# Patient Record
Sex: Female | Born: 1950 | Race: White | Hispanic: No | Marital: Married
Health system: Southern US, Community
[De-identification: ages and names within clinical notes are randomized; demographics above are authoritative.]

---

## 1999-10-10 ENCOUNTER — Other Ambulatory Visit: Admission: RE | Admit: 1999-10-10 | Discharge: 1999-10-10 | Payer: Self-pay | Admitting: *Deleted

## 1999-11-15 ENCOUNTER — Ambulatory Visit (HOSPITAL_COMMUNITY): Admission: RE | Admit: 1999-11-15 | Discharge: 1999-11-15 | Payer: Self-pay | Admitting: Obstetrics and Gynecology

## 2002-02-24 ENCOUNTER — Other Ambulatory Visit: Admission: RE | Admit: 2002-02-24 | Discharge: 2002-02-24 | Payer: Self-pay | Admitting: Obstetrics and Gynecology

## 2003-03-16 ENCOUNTER — Other Ambulatory Visit: Admission: RE | Admit: 2003-03-16 | Discharge: 2003-03-16 | Payer: Self-pay | Admitting: Obstetrics and Gynecology

## 2004-04-04 ENCOUNTER — Other Ambulatory Visit: Admission: RE | Admit: 2004-04-04 | Discharge: 2004-04-04 | Payer: Self-pay | Admitting: Obstetrics and Gynecology

## 2014-07-24 ENCOUNTER — Other Ambulatory Visit: Payer: Self-pay | Admitting: Obstetrics & Gynecology

## 2014-07-24 DIAGNOSIS — R928 Other abnormal and inconclusive findings on diagnostic imaging of breast: Secondary | ICD-10-CM

## 2014-07-30 ENCOUNTER — Ambulatory Visit
Admission: RE | Admit: 2014-07-30 | Discharge: 2014-07-30 | Disposition: A | Discharge status: Home / Self Care | Payer: BC Managed Care – PPO | Source: Ambulatory Visit | Attending: Obstetrics & Gynecology | Admitting: Obstetrics & Gynecology

## 2014-07-30 ENCOUNTER — Encounter (INDEPENDENT_AMBULATORY_CARE_PROVIDER_SITE_OTHER): Payer: Self-pay

## 2014-07-30 DIAGNOSIS — R928 Other abnormal and inconclusive findings on diagnostic imaging of breast: Secondary | ICD-10-CM

## 2015-04-02 IMAGING — MG MM DIAGNOSTIC UNILATERAL L
2 series · 2 of 2 positions shown · non-contrast
Comparison: 07/20/2014

CLINICAL DATA: Possible mass left breast identified on recent
screening mammogram dated 07/20/2014.

EXAM:
DIGITAL DIAGNOSTIC  LEFT MAMMOGRAM
ULTRASOUND LEFT BREAST

[L MLO]
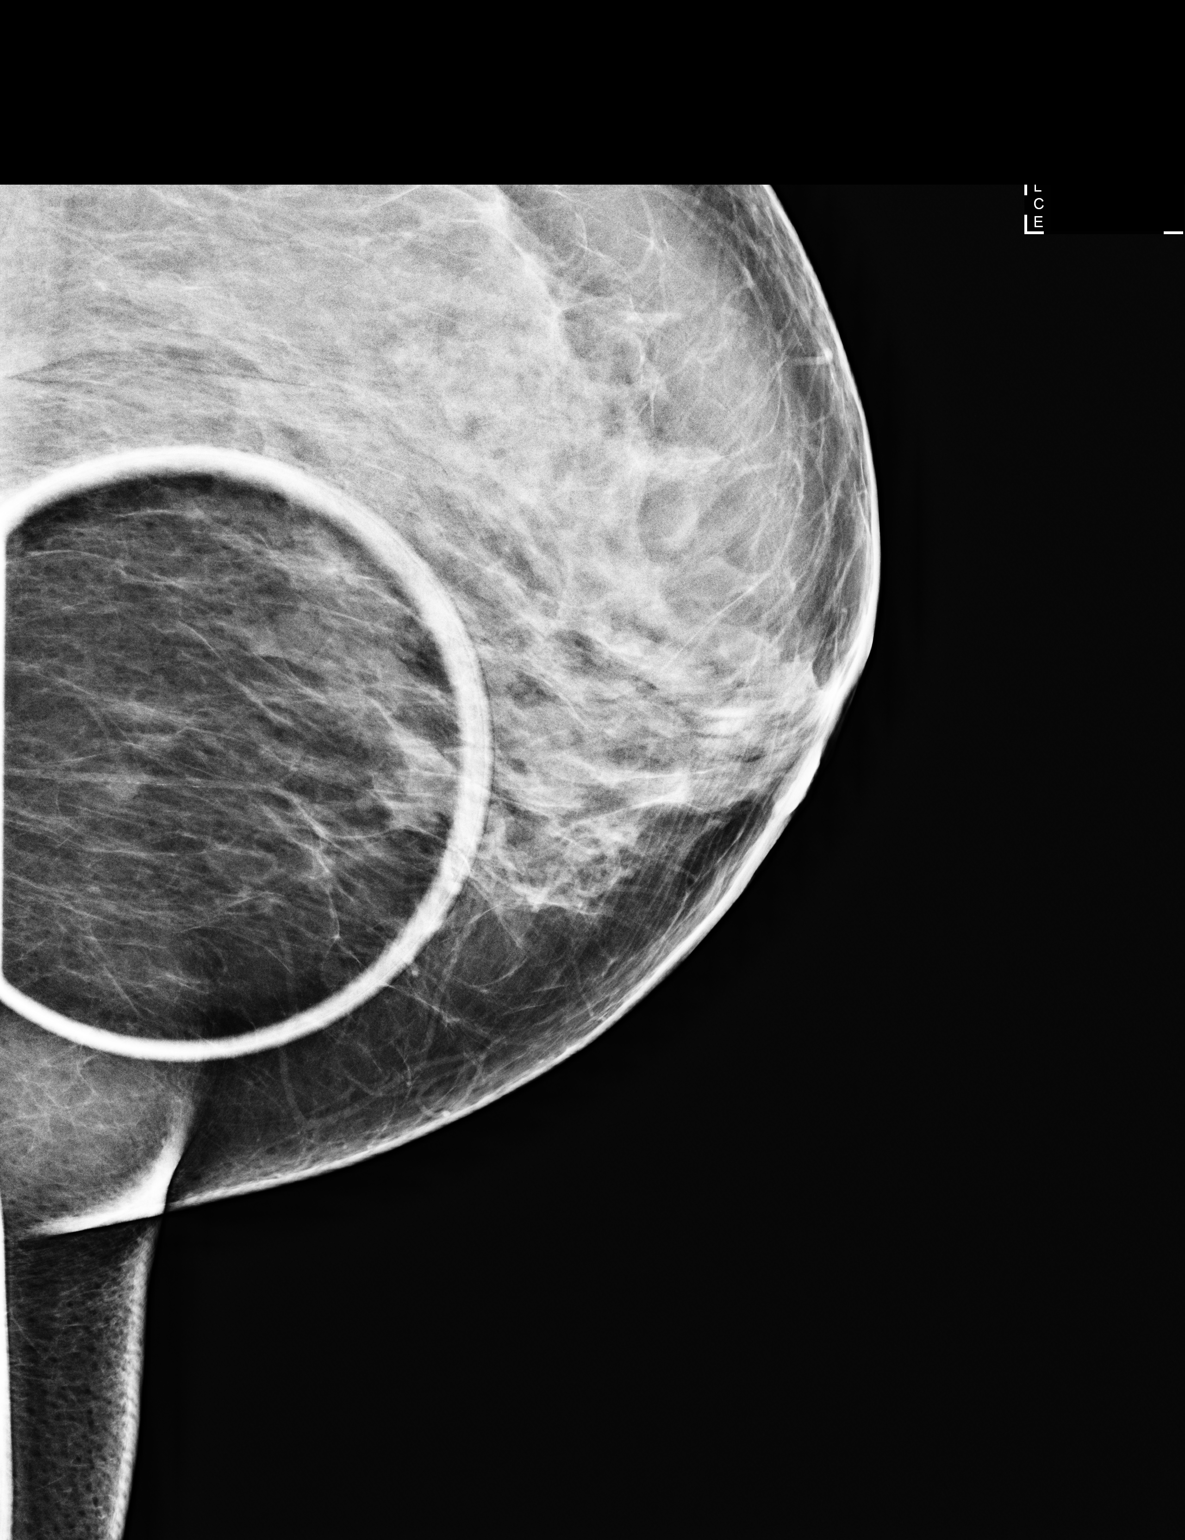

[L CC]
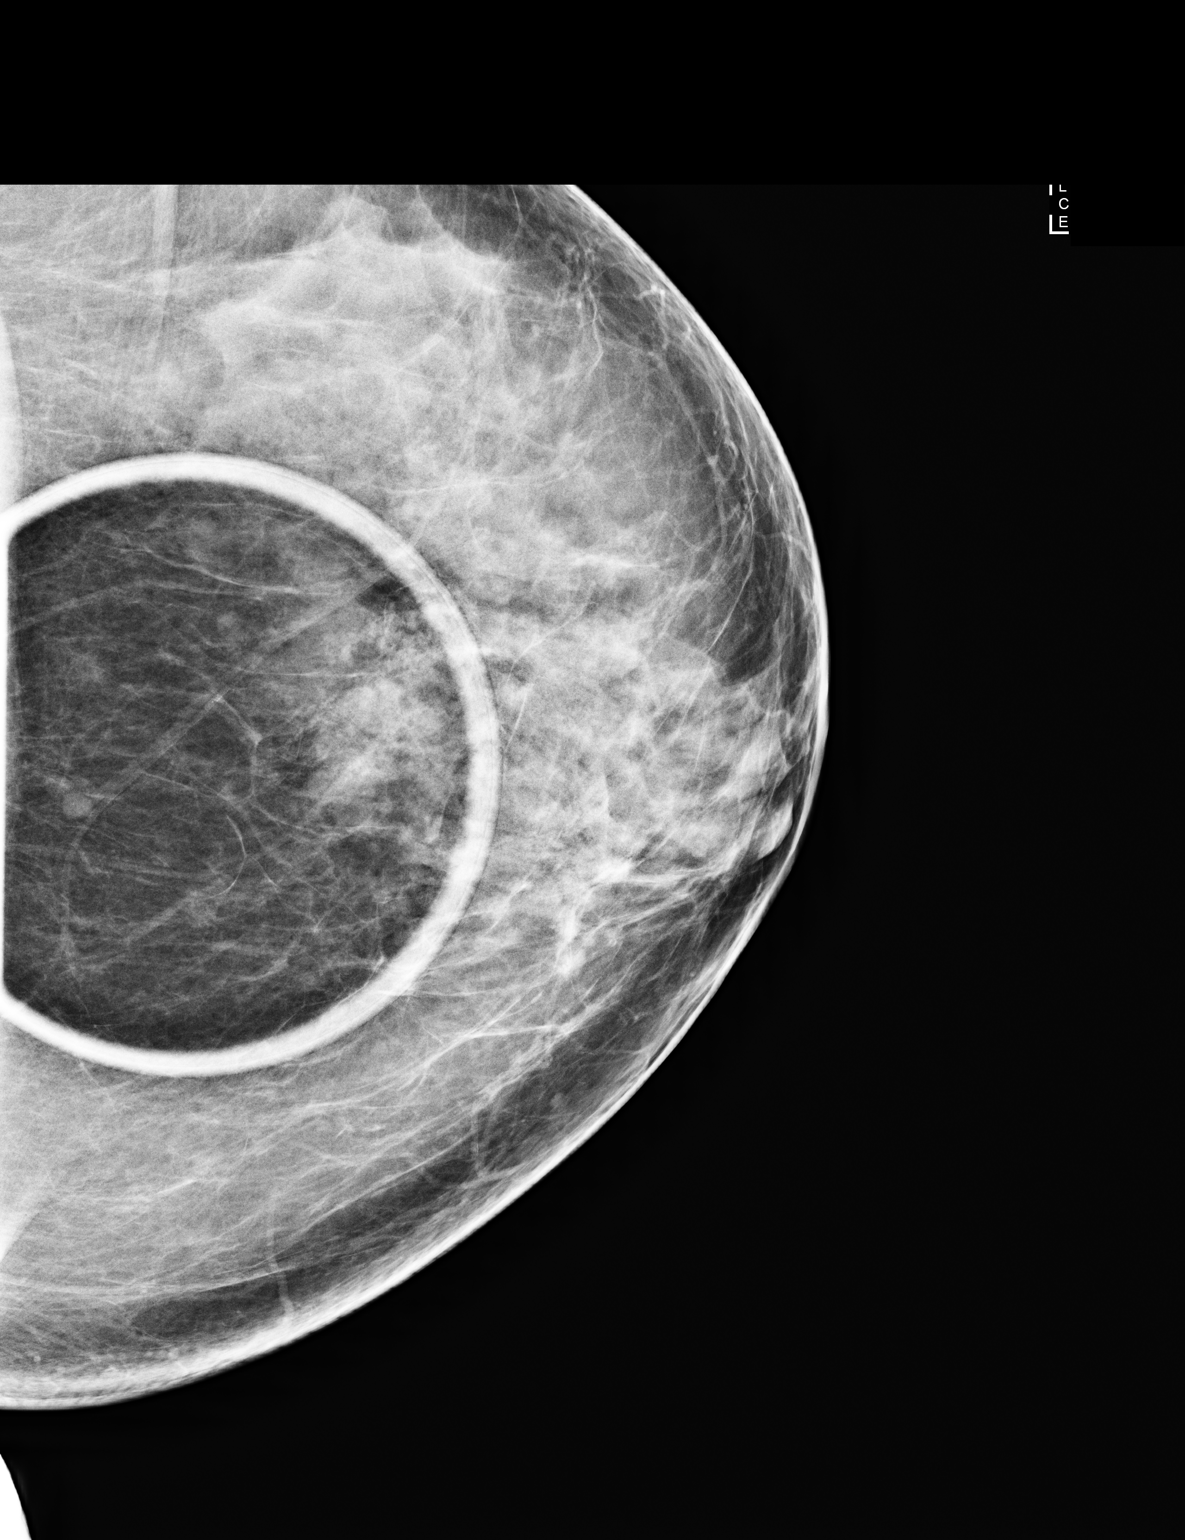

[2 of 2 positions shown; findings below may reference images not displayed]

ACR Breast Density Category c: The breast tissue is heterogeneously
dense, which may obscure small masses.
FINDINGS: Focal spot compression views of the lower central left breast
demonstrate a circumscribed subcentimeter nodule.

On physical exam, no mass is palpated in the inferior left breast.

Ultrasound is performed, showing a 5 x 4 x 3 mm simple cyst at 6
o'clock position 2 cm from the nipple. No solid or suspicious masses
seen on ultrasound.
IMPRESSION: 5 mm simple cyst 6 o'clock position left breast. No evidence of
malignancy.

RECOMMENDATION:
Screening mammogram in one year.(Code:[id number])

I have discussed the findings and recommendations with the patient.
Results were also provided in writing at the conclusion of the
visit. If applicable, a reminder letter will be sent to the patient
regarding the next appointment.

BI-RADS CATEGORY  2: Benign.

## 2016-08-02 DIAGNOSIS — E785 Hyperlipidemia, unspecified: Secondary | ICD-10-CM | POA: Diagnosis not present

## 2016-08-02 DIAGNOSIS — Z23 Encounter for immunization: Secondary | ICD-10-CM | POA: Diagnosis not present

## 2016-08-02 DIAGNOSIS — Z1389 Encounter for screening for other disorder: Secondary | ICD-10-CM | POA: Diagnosis not present

## 2016-08-16 DIAGNOSIS — Z6824 Body mass index (BMI) 24.0-24.9, adult: Secondary | ICD-10-CM | POA: Diagnosis not present

## 2016-08-16 DIAGNOSIS — N390 Urinary tract infection, site not specified: Secondary | ICD-10-CM | POA: Diagnosis not present

## 2016-08-16 DIAGNOSIS — Z01419 Encounter for gynecological examination (general) (routine) without abnormal findings: Secondary | ICD-10-CM | POA: Diagnosis not present

## 2016-08-16 DIAGNOSIS — Z124 Encounter for screening for malignant neoplasm of cervix: Secondary | ICD-10-CM | POA: Diagnosis not present

## 2016-08-16 DIAGNOSIS — Z1231 Encounter for screening mammogram for malignant neoplasm of breast: Secondary | ICD-10-CM | POA: Diagnosis not present

## 2016-08-28 DIAGNOSIS — N940 Mittelschmerz: Secondary | ICD-10-CM | POA: Diagnosis not present

## 2016-08-28 DIAGNOSIS — N840 Polyp of corpus uteri: Secondary | ICD-10-CM | POA: Diagnosis not present

## 2016-08-28 DIAGNOSIS — N950 Postmenopausal bleeding: Secondary | ICD-10-CM | POA: Diagnosis not present

## 2016-09-04 DIAGNOSIS — Z09 Encounter for follow-up examination after completed treatment for conditions other than malignant neoplasm: Secondary | ICD-10-CM | POA: Diagnosis not present

## 2016-09-04 DIAGNOSIS — D509 Iron deficiency anemia, unspecified: Secondary | ICD-10-CM | POA: Diagnosis not present

## 2016-10-19 DIAGNOSIS — R102 Pelvic and perineal pain: Secondary | ICD-10-CM | POA: Diagnosis not present

## 2016-12-06 DIAGNOSIS — N840 Polyp of corpus uteri: Secondary | ICD-10-CM | POA: Diagnosis not present

## 2016-12-06 DIAGNOSIS — N950 Postmenopausal bleeding: Secondary | ICD-10-CM | POA: Diagnosis not present

## 2017-01-17 DIAGNOSIS — Z1211 Encounter for screening for malignant neoplasm of colon: Secondary | ICD-10-CM | POA: Diagnosis not present

## 2017-01-17 DIAGNOSIS — Z1389 Encounter for screening for other disorder: Secondary | ICD-10-CM | POA: Diagnosis not present

## 2017-01-17 DIAGNOSIS — Z0000 Encounter for general adult medical examination without abnormal findings: Secondary | ICD-10-CM | POA: Diagnosis not present

## 2017-06-27 DIAGNOSIS — H10413 Chronic giant papillary conjunctivitis, bilateral: Secondary | ICD-10-CM | POA: Diagnosis not present

## 2017-07-18 DIAGNOSIS — M549 Dorsalgia, unspecified: Secondary | ICD-10-CM | POA: Diagnosis not present

## 2017-07-18 DIAGNOSIS — N390 Urinary tract infection, site not specified: Secondary | ICD-10-CM | POA: Diagnosis not present

## 2017-07-18 DIAGNOSIS — E785 Hyperlipidemia, unspecified: Secondary | ICD-10-CM | POA: Diagnosis not present

## 2017-07-18 DIAGNOSIS — Z1389 Encounter for screening for other disorder: Secondary | ICD-10-CM | POA: Diagnosis not present

## 2017-08-28 DIAGNOSIS — Z23 Encounter for immunization: Secondary | ICD-10-CM | POA: Diagnosis not present

## 2017-08-28 DIAGNOSIS — J309 Allergic rhinitis, unspecified: Secondary | ICD-10-CM | POA: Diagnosis not present

## 2017-12-24 DIAGNOSIS — Z6825 Body mass index (BMI) 25.0-25.9, adult: Secondary | ICD-10-CM | POA: Diagnosis not present

## 2017-12-24 DIAGNOSIS — Z1231 Encounter for screening mammogram for malignant neoplasm of breast: Secondary | ICD-10-CM | POA: Diagnosis not present

## 2017-12-24 DIAGNOSIS — Z01419 Encounter for gynecological examination (general) (routine) without abnormal findings: Secondary | ICD-10-CM | POA: Diagnosis not present

## 2018-01-15 DIAGNOSIS — Z1211 Encounter for screening for malignant neoplasm of colon: Secondary | ICD-10-CM | POA: Diagnosis not present

## 2018-01-15 DIAGNOSIS — Z1212 Encounter for screening for malignant neoplasm of rectum: Secondary | ICD-10-CM | POA: Diagnosis not present

## 2018-01-21 DIAGNOSIS — J309 Allergic rhinitis, unspecified: Secondary | ICD-10-CM | POA: Diagnosis not present

## 2018-01-21 DIAGNOSIS — Z0000 Encounter for general adult medical examination without abnormal findings: Secondary | ICD-10-CM | POA: Diagnosis not present

## 2018-01-21 DIAGNOSIS — M8588 Other specified disorders of bone density and structure, other site: Secondary | ICD-10-CM | POA: Diagnosis not present

## 2018-01-21 DIAGNOSIS — E785 Hyperlipidemia, unspecified: Secondary | ICD-10-CM | POA: Diagnosis not present

## 2018-05-01 DIAGNOSIS — B373 Candidiasis of vulva and vagina: Secondary | ICD-10-CM | POA: Diagnosis not present

## 2018-05-01 DIAGNOSIS — R030 Elevated blood-pressure reading, without diagnosis of hypertension: Secondary | ICD-10-CM | POA: Diagnosis not present

## 2018-05-01 DIAGNOSIS — R350 Frequency of micturition: Secondary | ICD-10-CM | POA: Diagnosis not present

## 2018-05-06 DIAGNOSIS — R109 Unspecified abdominal pain: Secondary | ICD-10-CM | POA: Diagnosis not present

## 2018-05-06 DIAGNOSIS — I10 Essential (primary) hypertension: Secondary | ICD-10-CM | POA: Diagnosis not present

## 2018-06-05 DIAGNOSIS — I10 Essential (primary) hypertension: Secondary | ICD-10-CM | POA: Diagnosis not present

## 2018-08-29 DIAGNOSIS — M26629 Arthralgia of temporomandibular joint, unspecified side: Secondary | ICD-10-CM | POA: Diagnosis not present

## 2018-08-29 DIAGNOSIS — Z23 Encounter for immunization: Secondary | ICD-10-CM | POA: Diagnosis not present

## 2018-08-29 DIAGNOSIS — F439 Reaction to severe stress, unspecified: Secondary | ICD-10-CM | POA: Diagnosis not present

## 2018-10-03 DIAGNOSIS — N390 Urinary tract infection, site not specified: Secondary | ICD-10-CM | POA: Diagnosis not present

## 2018-10-07 DIAGNOSIS — N390 Urinary tract infection, site not specified: Secondary | ICD-10-CM | POA: Diagnosis not present

## 2018-12-25 DIAGNOSIS — M8588 Other specified disorders of bone density and structure, other site: Secondary | ICD-10-CM | POA: Diagnosis not present

## 2018-12-25 DIAGNOSIS — Z124 Encounter for screening for malignant neoplasm of cervix: Secondary | ICD-10-CM | POA: Diagnosis not present

## 2018-12-25 DIAGNOSIS — Z1231 Encounter for screening mammogram for malignant neoplasm of breast: Secondary | ICD-10-CM | POA: Diagnosis not present

## 2018-12-25 DIAGNOSIS — N958 Other specified menopausal and perimenopausal disorders: Secondary | ICD-10-CM | POA: Diagnosis not present

## 2018-12-25 DIAGNOSIS — Z6825 Body mass index (BMI) 25.0-25.9, adult: Secondary | ICD-10-CM | POA: Diagnosis not present

## 2019-01-13 DIAGNOSIS — J101 Influenza due to other identified influenza virus with other respiratory manifestations: Secondary | ICD-10-CM | POA: Diagnosis not present

## 2019-01-27 DIAGNOSIS — Z1389 Encounter for screening for other disorder: Secondary | ICD-10-CM | POA: Diagnosis not present

## 2019-01-27 DIAGNOSIS — J309 Allergic rhinitis, unspecified: Secondary | ICD-10-CM | POA: Diagnosis not present

## 2019-01-27 DIAGNOSIS — I10 Essential (primary) hypertension: Secondary | ICD-10-CM | POA: Diagnosis not present

## 2019-01-27 DIAGNOSIS — Z0000 Encounter for general adult medical examination without abnormal findings: Secondary | ICD-10-CM | POA: Diagnosis not present

## 2019-01-27 DIAGNOSIS — Z23 Encounter for immunization: Secondary | ICD-10-CM | POA: Diagnosis not present

## 2019-01-27 DIAGNOSIS — E785 Hyperlipidemia, unspecified: Secondary | ICD-10-CM | POA: Diagnosis not present

## 2019-01-27 DIAGNOSIS — M8588 Other specified disorders of bone density and structure, other site: Secondary | ICD-10-CM | POA: Diagnosis not present

## 2019-03-17 DIAGNOSIS — Z0000 Encounter for general adult medical examination without abnormal findings: Secondary | ICD-10-CM | POA: Diagnosis not present

## 2019-03-17 DIAGNOSIS — R3121 Asymptomatic microscopic hematuria: Secondary | ICD-10-CM | POA: Diagnosis not present

## 2019-04-30 DIAGNOSIS — N390 Urinary tract infection, site not specified: Secondary | ICD-10-CM | POA: Diagnosis not present

## 2019-09-11 DIAGNOSIS — G43909 Migraine, unspecified, not intractable, without status migrainosus: Secondary | ICD-10-CM | POA: Insufficient documentation

## 2019-09-12 DIAGNOSIS — R350 Frequency of micturition: Secondary | ICD-10-CM | POA: Diagnosis not present

## 2019-09-12 DIAGNOSIS — N760 Acute vaginitis: Secondary | ICD-10-CM | POA: Diagnosis not present

## 2019-09-12 DIAGNOSIS — L293 Anogenital pruritus, unspecified: Secondary | ICD-10-CM | POA: Diagnosis not present

## 2019-09-12 DIAGNOSIS — R300 Dysuria: Secondary | ICD-10-CM | POA: Diagnosis not present

## 2019-10-22 DIAGNOSIS — Z23 Encounter for immunization: Secondary | ICD-10-CM | POA: Diagnosis not present

## 2019-10-22 DIAGNOSIS — E785 Hyperlipidemia, unspecified: Secondary | ICD-10-CM | POA: Diagnosis not present

## 2019-10-22 DIAGNOSIS — I10 Essential (primary) hypertension: Secondary | ICD-10-CM | POA: Diagnosis not present

## 2020-01-05 DIAGNOSIS — Z1231 Encounter for screening mammogram for malignant neoplasm of breast: Secondary | ICD-10-CM | POA: Diagnosis not present

## 2020-01-05 DIAGNOSIS — Z6824 Body mass index (BMI) 24.0-24.9, adult: Secondary | ICD-10-CM | POA: Diagnosis not present

## 2020-01-05 DIAGNOSIS — N390 Urinary tract infection, site not specified: Secondary | ICD-10-CM | POA: Diagnosis not present

## 2020-01-05 DIAGNOSIS — Z124 Encounter for screening for malignant neoplasm of cervix: Secondary | ICD-10-CM | POA: Diagnosis not present

## 2020-02-04 DIAGNOSIS — M8588 Other specified disorders of bone density and structure, other site: Secondary | ICD-10-CM | POA: Diagnosis not present

## 2020-02-04 DIAGNOSIS — Z1389 Encounter for screening for other disorder: Secondary | ICD-10-CM | POA: Diagnosis not present

## 2020-02-04 DIAGNOSIS — E785 Hyperlipidemia, unspecified: Secondary | ICD-10-CM | POA: Diagnosis not present

## 2020-02-04 DIAGNOSIS — I10 Essential (primary) hypertension: Secondary | ICD-10-CM | POA: Diagnosis not present

## 2020-02-04 DIAGNOSIS — J309 Allergic rhinitis, unspecified: Secondary | ICD-10-CM | POA: Diagnosis not present

## 2020-02-04 DIAGNOSIS — Z0000 Encounter for general adult medical examination without abnormal findings: Secondary | ICD-10-CM | POA: Diagnosis not present

## 2020-02-05 ENCOUNTER — Ambulatory Visit: Payer: Self-pay | Attending: Internal Medicine

## 2020-02-05 DIAGNOSIS — Z23 Encounter for immunization: Secondary | ICD-10-CM | POA: Insufficient documentation

## 2020-02-05 NOTE — Progress Notes (Signed)
   Covid-19 Vaccination Clinic  Name:  Elaine Gilbert    MRN: [id number] DOB: 1951/08/06  02/05/2020  [name] was observed post Covid-19 immunization for 15 minutes without incident. She was provided with Vaccine Information Sheet and instruction to access the V-Safe system.   [name] was instructed to call 911 with any severe reactions post vaccine: Marland Kitchen Difficulty breathing  . Swelling of face and throat  . A fast heartbeat  . A bad rash all over body  . Dizziness and weakness   Immunizations Administered    Name Date Dose VIS Date Route   Moderna COVID-19 Vaccine 02/05/2020  9:16 AM 0.5 mL 11/04/2019 Intramuscular   Manufacturer: Moderna   Lot: [id number]   NDC: 50871-994-12

## 2020-03-10 ENCOUNTER — Ambulatory Visit: Payer: PPO | Attending: Internal Medicine

## 2020-03-10 DIAGNOSIS — Z23 Encounter for immunization: Secondary | ICD-10-CM

## 2020-03-10 NOTE — Progress Notes (Signed)
   Covid-19 Vaccination Clinic  Name:  Elaine Gilbert    MRN: [id number] DOB: Mar 30, 1951  03/10/2020  [name] was observed post Covid-19 immunization for 15 minutes without incident. She was provided with Vaccine Information Sheet and instruction to access the V-Safe system.   [name] was instructed to call 911 with any severe reactions post vaccine: Marland Kitchen Difficulty breathing  . Swelling of face and throat  . A fast heartbeat  . A bad rash all over body  . Dizziness and weakness   Immunizations Administered    Name Date Dose VIS Date Route   Moderna COVID-19 Vaccine 03/10/2020  1:38 PM 0.5 mL 11/04/2019 Intramuscular   Manufacturer: Gala Murdoch   Lot: [id number]   NDC: 16429-037-95

## 2020-06-16 DIAGNOSIS — N390 Urinary tract infection, site not specified: Secondary | ICD-10-CM | POA: Diagnosis not present

## 2020-08-04 DIAGNOSIS — I10 Essential (primary) hypertension: Secondary | ICD-10-CM | POA: Diagnosis not present

## 2020-08-04 DIAGNOSIS — E785 Hyperlipidemia, unspecified: Secondary | ICD-10-CM | POA: Diagnosis not present

## 2020-08-04 DIAGNOSIS — R7309 Other abnormal glucose: Secondary | ICD-10-CM | POA: Diagnosis not present

## 2021-01-17 DIAGNOSIS — H2513 Age-related nuclear cataract, bilateral: Secondary | ICD-10-CM | POA: Diagnosis not present

## 2021-01-17 DIAGNOSIS — H33323 Round hole, bilateral: Secondary | ICD-10-CM | POA: Diagnosis not present

## 2021-01-17 DIAGNOSIS — H43813 Vitreous degeneration, bilateral: Secondary | ICD-10-CM | POA: Diagnosis not present

## 2021-01-17 DIAGNOSIS — H35033 Hypertensive retinopathy, bilateral: Secondary | ICD-10-CM | POA: Diagnosis not present

## 2021-01-17 DIAGNOSIS — H33322 Round hole, left eye: Secondary | ICD-10-CM | POA: Diagnosis not present

## 2021-02-09 DIAGNOSIS — R350 Frequency of micturition: Secondary | ICD-10-CM | POA: Diagnosis not present

## 2021-02-09 DIAGNOSIS — Z1231 Encounter for screening mammogram for malignant neoplasm of breast: Secondary | ICD-10-CM | POA: Diagnosis not present

## 2021-02-09 DIAGNOSIS — Z6824 Body mass index (BMI) 24.0-24.9, adult: Secondary | ICD-10-CM | POA: Diagnosis not present

## 2021-02-09 DIAGNOSIS — N760 Acute vaginitis: Secondary | ICD-10-CM | POA: Diagnosis not present

## 2021-02-09 DIAGNOSIS — Z01419 Encounter for gynecological examination (general) (routine) without abnormal findings: Secondary | ICD-10-CM | POA: Diagnosis not present

## 2021-03-30 DIAGNOSIS — E785 Hyperlipidemia, unspecified: Secondary | ICD-10-CM | POA: Diagnosis not present

## 2021-03-30 DIAGNOSIS — Z1389 Encounter for screening for other disorder: Secondary | ICD-10-CM | POA: Diagnosis not present

## 2021-03-30 DIAGNOSIS — M8588 Other specified disorders of bone density and structure, other site: Secondary | ICD-10-CM | POA: Diagnosis not present

## 2021-03-30 DIAGNOSIS — Z0000 Encounter for general adult medical examination without abnormal findings: Secondary | ICD-10-CM | POA: Diagnosis not present

## 2021-03-30 DIAGNOSIS — J309 Allergic rhinitis, unspecified: Secondary | ICD-10-CM | POA: Diagnosis not present

## 2021-03-30 DIAGNOSIS — I10 Essential (primary) hypertension: Secondary | ICD-10-CM | POA: Diagnosis not present

## 2021-04-18 DIAGNOSIS — Z1212 Encounter for screening for malignant neoplasm of rectum: Secondary | ICD-10-CM | POA: Diagnosis not present

## 2021-04-18 DIAGNOSIS — Z1211 Encounter for screening for malignant neoplasm of colon: Secondary | ICD-10-CM | POA: Diagnosis not present

## 2021-04-23 LAB — COLOGUARD: COLOGUARD: NEGATIVE

## 2021-04-23 LAB — EXTERNAL GENERIC LAB PROCEDURE: COLOGUARD: NEGATIVE

## 2021-06-07 DIAGNOSIS — U071 COVID-19: Secondary | ICD-10-CM | POA: Diagnosis not present

## 2021-06-07 DIAGNOSIS — R509 Fever, unspecified: Secondary | ICD-10-CM | POA: Diagnosis not present

## 2021-06-07 DIAGNOSIS — R0981 Nasal congestion: Secondary | ICD-10-CM | POA: Diagnosis not present

## 2021-06-07 DIAGNOSIS — R059 Cough, unspecified: Secondary | ICD-10-CM | POA: Diagnosis not present

## 2021-07-20 DIAGNOSIS — H2513 Age-related nuclear cataract, bilateral: Secondary | ICD-10-CM | POA: Diagnosis not present

## 2021-07-20 DIAGNOSIS — H33323 Round hole, bilateral: Secondary | ICD-10-CM | POA: Diagnosis not present

## 2021-07-20 DIAGNOSIS — H43813 Vitreous degeneration, bilateral: Secondary | ICD-10-CM | POA: Diagnosis not present

## 2021-07-26 DIAGNOSIS — H33322 Round hole, left eye: Secondary | ICD-10-CM | POA: Diagnosis not present

## 2021-08-02 DIAGNOSIS — N390 Urinary tract infection, site not specified: Secondary | ICD-10-CM | POA: Diagnosis not present

## 2021-08-10 DIAGNOSIS — N390 Urinary tract infection, site not specified: Secondary | ICD-10-CM | POA: Diagnosis not present

## 2021-08-16 DIAGNOSIS — H33321 Round hole, right eye: Secondary | ICD-10-CM | POA: Diagnosis not present

## 2021-08-23 DIAGNOSIS — N760 Acute vaginitis: Secondary | ICD-10-CM | POA: Diagnosis not present

## 2021-08-23 DIAGNOSIS — R102 Pelvic and perineal pain: Secondary | ICD-10-CM | POA: Diagnosis not present

## 2021-09-26 DIAGNOSIS — R7301 Impaired fasting glucose: Secondary | ICD-10-CM | POA: Diagnosis not present

## 2021-09-26 DIAGNOSIS — E785 Hyperlipidemia, unspecified: Secondary | ICD-10-CM | POA: Diagnosis not present

## 2021-09-26 DIAGNOSIS — Z23 Encounter for immunization: Secondary | ICD-10-CM | POA: Diagnosis not present

## 2021-09-26 DIAGNOSIS — I10 Essential (primary) hypertension: Secondary | ICD-10-CM | POA: Diagnosis not present

## 2021-10-12 DIAGNOSIS — H35033 Hypertensive retinopathy, bilateral: Secondary | ICD-10-CM | POA: Diagnosis not present

## 2021-10-12 DIAGNOSIS — H31093 Other chorioretinal scars, bilateral: Secondary | ICD-10-CM | POA: Diagnosis not present

## 2021-10-12 DIAGNOSIS — H43813 Vitreous degeneration, bilateral: Secondary | ICD-10-CM | POA: Diagnosis not present

## 2021-10-12 DIAGNOSIS — H2513 Age-related nuclear cataract, bilateral: Secondary | ICD-10-CM | POA: Diagnosis not present

## 2021-12-26 DIAGNOSIS — N390 Urinary tract infection, site not specified: Secondary | ICD-10-CM | POA: Diagnosis not present

## 2022-02-22 DIAGNOSIS — H04123 Dry eye syndrome of bilateral lacrimal glands: Secondary | ICD-10-CM | POA: Diagnosis not present

## 2022-04-04 DIAGNOSIS — E785 Hyperlipidemia, unspecified: Secondary | ICD-10-CM | POA: Diagnosis not present

## 2022-04-04 DIAGNOSIS — I10 Essential (primary) hypertension: Secondary | ICD-10-CM | POA: Diagnosis not present

## 2022-04-04 DIAGNOSIS — J309 Allergic rhinitis, unspecified: Secondary | ICD-10-CM | POA: Diagnosis not present

## 2022-04-04 DIAGNOSIS — M8588 Other specified disorders of bone density and structure, other site: Secondary | ICD-10-CM | POA: Diagnosis not present

## 2022-04-04 DIAGNOSIS — Z1331 Encounter for screening for depression: Secondary | ICD-10-CM | POA: Diagnosis not present

## 2022-04-04 DIAGNOSIS — Z0000 Encounter for general adult medical examination without abnormal findings: Secondary | ICD-10-CM | POA: Diagnosis not present

## 2022-06-21 DIAGNOSIS — R202 Paresthesia of skin: Secondary | ICD-10-CM | POA: Diagnosis not present

## 2022-06-21 DIAGNOSIS — N390 Urinary tract infection, site not specified: Secondary | ICD-10-CM | POA: Diagnosis not present

## 2022-06-21 DIAGNOSIS — M25562 Pain in left knee: Secondary | ICD-10-CM | POA: Diagnosis not present

## 2022-07-26 DIAGNOSIS — N958 Other specified menopausal and perimenopausal disorders: Secondary | ICD-10-CM | POA: Diagnosis not present

## 2022-07-26 DIAGNOSIS — Z6824 Body mass index (BMI) 24.0-24.9, adult: Secondary | ICD-10-CM | POA: Diagnosis not present

## 2022-07-26 DIAGNOSIS — Z1231 Encounter for screening mammogram for malignant neoplasm of breast: Secondary | ICD-10-CM | POA: Diagnosis not present

## 2022-07-26 DIAGNOSIS — M816 Localized osteoporosis [Lequesne]: Secondary | ICD-10-CM | POA: Diagnosis not present

## 2022-07-26 DIAGNOSIS — Z124 Encounter for screening for malignant neoplasm of cervix: Secondary | ICD-10-CM | POA: Diagnosis not present

## 2022-07-26 DIAGNOSIS — Z8262 Family history of osteoporosis: Secondary | ICD-10-CM | POA: Diagnosis not present

## 2022-07-31 ENCOUNTER — Other Ambulatory Visit: Payer: Self-pay | Admitting: Obstetrics and Gynecology

## 2022-07-31 DIAGNOSIS — R928 Other abnormal and inconclusive findings on diagnostic imaging of breast: Secondary | ICD-10-CM

## 2022-08-14 ENCOUNTER — Ambulatory Visit
Admission: RE | Admit: 2022-08-14 | Discharge: 2022-08-14 | Disposition: A | Discharge status: Home / Self Care | Payer: PPO | Source: Ambulatory Visit | Attending: Obstetrics and Gynecology | Admitting: Obstetrics and Gynecology

## 2022-08-14 ENCOUNTER — Ambulatory Visit: Payer: PPO

## 2022-08-14 DIAGNOSIS — R921 Mammographic calcification found on diagnostic imaging of breast: Secondary | ICD-10-CM | POA: Diagnosis not present

## 2022-08-14 DIAGNOSIS — R928 Other abnormal and inconclusive findings on diagnostic imaging of breast: Secondary | ICD-10-CM

## 2022-08-17 DIAGNOSIS — M810 Age-related osteoporosis without current pathological fracture: Secondary | ICD-10-CM | POA: Diagnosis not present

## 2022-08-30 DIAGNOSIS — Z23 Encounter for immunization: Secondary | ICD-10-CM | POA: Diagnosis not present

## 2022-08-30 DIAGNOSIS — I10 Essential (primary) hypertension: Secondary | ICD-10-CM | POA: Diagnosis not present

## 2022-08-30 DIAGNOSIS — E785 Hyperlipidemia, unspecified: Secondary | ICD-10-CM | POA: Diagnosis not present

## 2022-08-30 DIAGNOSIS — Z1211 Encounter for screening for malignant neoplasm of colon: Secondary | ICD-10-CM | POA: Diagnosis not present

## 2022-11-22 DIAGNOSIS — R7989 Other specified abnormal findings of blood chemistry: Secondary | ICD-10-CM | POA: Diagnosis not present

## 2023-02-06 DIAGNOSIS — Z1331 Encounter for screening for depression: Secondary | ICD-10-CM | POA: Diagnosis not present

## 2023-02-06 DIAGNOSIS — Z0000 Encounter for general adult medical examination without abnormal findings: Secondary | ICD-10-CM | POA: Diagnosis not present

## 2023-02-06 DIAGNOSIS — M810 Age-related osteoporosis without current pathological fracture: Secondary | ICD-10-CM | POA: Diagnosis not present

## 2023-02-06 DIAGNOSIS — J309 Allergic rhinitis, unspecified: Secondary | ICD-10-CM | POA: Diagnosis not present

## 2023-02-06 DIAGNOSIS — I10 Essential (primary) hypertension: Secondary | ICD-10-CM | POA: Diagnosis not present

## 2023-02-06 DIAGNOSIS — E785 Hyperlipidemia, unspecified: Secondary | ICD-10-CM | POA: Diagnosis not present

## 2023-02-06 DIAGNOSIS — Z23 Encounter for immunization: Secondary | ICD-10-CM | POA: Diagnosis not present

## 2023-03-06 DIAGNOSIS — B9689 Other specified bacterial agents as the cause of diseases classified elsewhere: Secondary | ICD-10-CM | POA: Diagnosis not present

## 2023-03-06 DIAGNOSIS — Z20822 Contact with and (suspected) exposure to covid-19: Secondary | ICD-10-CM | POA: Diagnosis not present

## 2023-03-06 DIAGNOSIS — J329 Chronic sinusitis, unspecified: Secondary | ICD-10-CM | POA: Diagnosis not present

## 2023-04-16 DIAGNOSIS — F430 Acute stress reaction: Secondary | ICD-10-CM | POA: Diagnosis not present

## 2023-04-16 DIAGNOSIS — R319 Hematuria, unspecified: Secondary | ICD-10-CM | POA: Diagnosis not present

## 2023-06-13 DIAGNOSIS — J342 Deviated nasal septum: Secondary | ICD-10-CM | POA: Diagnosis not present

## 2023-06-13 DIAGNOSIS — J343 Hypertrophy of nasal turbinates: Secondary | ICD-10-CM | POA: Diagnosis not present

## 2023-06-13 DIAGNOSIS — R430 Anosmia: Secondary | ICD-10-CM | POA: Diagnosis not present

## 2023-07-23 DIAGNOSIS — J343 Hypertrophy of nasal turbinates: Secondary | ICD-10-CM | POA: Diagnosis not present

## 2023-07-23 DIAGNOSIS — J342 Deviated nasal septum: Secondary | ICD-10-CM | POA: Diagnosis not present

## 2023-07-23 DIAGNOSIS — R430 Anosmia: Secondary | ICD-10-CM | POA: Diagnosis not present

## 2023-07-30 DIAGNOSIS — F419 Anxiety disorder, unspecified: Secondary | ICD-10-CM | POA: Diagnosis not present

## 2023-07-30 DIAGNOSIS — N952 Postmenopausal atrophic vaginitis: Secondary | ICD-10-CM | POA: Diagnosis not present

## 2023-07-30 DIAGNOSIS — Z1231 Encounter for screening mammogram for malignant neoplasm of breast: Secondary | ICD-10-CM | POA: Diagnosis not present

## 2023-07-30 DIAGNOSIS — Z01419 Encounter for gynecological examination (general) (routine) without abnormal findings: Secondary | ICD-10-CM | POA: Diagnosis not present

## 2023-07-30 DIAGNOSIS — Z6824 Body mass index (BMI) 24.0-24.9, adult: Secondary | ICD-10-CM | POA: Diagnosis not present

## 2023-08-15 DIAGNOSIS — E785 Hyperlipidemia, unspecified: Secondary | ICD-10-CM | POA: Diagnosis not present

## 2023-08-15 DIAGNOSIS — F430 Acute stress reaction: Secondary | ICD-10-CM | POA: Diagnosis not present

## 2023-08-15 DIAGNOSIS — R7301 Impaired fasting glucose: Secondary | ICD-10-CM | POA: Diagnosis not present

## 2023-08-15 DIAGNOSIS — I10 Essential (primary) hypertension: Secondary | ICD-10-CM | POA: Diagnosis not present

## 2023-08-15 DIAGNOSIS — Z23 Encounter for immunization: Secondary | ICD-10-CM | POA: Diagnosis not present

## 2023-09-04 NOTE — Progress Notes (Unsigned)
   Rubin Payor, PhD, LAT, ATC acting as a scribe for Clementeen Graham, MD.  Elaine Gilbert is a 72 y.o. female who presents to [facility] at [facility] today for osteoporosis management. She notes having difficulty remembering to take the alendronate regularly.  She notes she does have some acid reflux and finds some soreness in her throat occasionally.  DEXA scan (date, T-score): 07/26/22: Spine= 0.9, L-FN= -2.6, R-FN= -2.5 Prior treatment: alendronate 70mg  History of Hip, Spine, or Wrist Fx: no Heart disease or stroke: no Cancer: no Kidney Disease: no Gastric/Peptic Ulcer: no Positive for acid reflux. Gastric bypass surgery: no Severe GERD: no Hx of seizures: no Age at Menopause: 45 Calcium intake: yes Vitamin D intake: yes Hormone replacement therapy: no Smoking history: no Alcohol: no Exercise: yes- walk 22miles/day Major dental work in past year: no Parents with hip/spine fracture: no Height loss: no    Pertinent review of systems: No fevers or chills  Relevant historical information: Osteoporosis   Exam:  BP 116/72   Pulse 69   Ht 5' 3.5" (1.613 m)   Wt 135 lb (61.2 kg)   SpO2 98%   BMI 23.54 kg/m  General: Well Developed, well nourished, and in no acute distress.   MSK: Normal lumbar motion normal gait.      Assessment and Plan: 72 y.o. female with osteoporosis typically managed with alendronate.  She is having difficulty with alendronate having some acid reflux and difficulty remembering to take it.  She is already doing a great job with weightbearing exercise walking most of the days a week.  She is taking some calcium and vitamin D supplementation.  Will go ahead and check vitamin D calcium and other electrolytes.  We talked about options.  Prolia would be a better option for her.  Will go ahead and work on authorization for [employer] and check preinjection labs.   PDMP not reviewed this encounter. Orders Placed This Encounter  Procedures    Comprehensive metabolic panel    Osteoporis    Standing Status:   Future    Number of Occurrences:   1    Standing Expiration Date:   09/04/2024   Magnesium    Therapeutic drug monitoring    Standing Status:   Future    Number of Occurrences:   1    Standing Expiration Date:   09/04/2024   Phosphorus    Osteroporis    Standing Status:   Future    Number of Occurrences:   1    Standing Expiration Date:   09/04/2024   VITAMIN D 25 Hydroxy (Vit-D Deficiency, Fractures)    Osteoporis    Standing Status:   Future    Number of Occurrences:   1    Standing Expiration Date:   09/04/2024   No orders of the defined types were placed in this encounter.    Discussed warning signs or symptoms. Please see discharge instructions. Patient expresses understanding.   The above documentation has been reviewed and is accurate and complete Clementeen Graham, M.D.

## 2023-09-05 ENCOUNTER — Ambulatory Visit: Payer: PPO | Admitting: Family Medicine

## 2023-09-05 VITALS — BP 116/72 | HR 69 | Ht 63.5 in | Wt 135.0 lb

## 2023-09-05 DIAGNOSIS — M810 Age-related osteoporosis without current pathological fracture: Secondary | ICD-10-CM | POA: Diagnosis not present

## 2023-09-05 DIAGNOSIS — E559 Vitamin D deficiency, unspecified: Secondary | ICD-10-CM | POA: Diagnosis not present

## 2023-09-05 LAB — COMPREHENSIVE METABOLIC PANEL
ALT: 17 U/L (ref 0–35)
AST: 19 U/L (ref 0–37)
Albumin: 4.3 g/dL (ref 3.5–5.2)
Alkaline Phosphatase: 48 U/L (ref 39–117)
BUN: 14 mg/dL (ref 6–23)
CO2: 27 meq/L (ref 19–32)
Calcium: 9.3 mg/dL (ref 8.4–10.5)
Chloride: 106 meq/L (ref 96–112)
Creatinine, Ser: 0.91 mg/dL (ref 0.40–1.20)
GFR: 63.18 mL/min (ref >60.00)
Glucose, Bld: 92 mg/dL (ref 70–99)
Potassium: 3.7 meq/L (ref 3.5–5.1)
Sodium: 141 meq/L (ref 135–145)
Total Bilirubin: 0.7 mg/dL (ref 0.2–1.2)
Total Protein: 6.9 g/dL (ref 6.0–8.3)

## 2023-09-05 LAB — MAGNESIUM: Magnesium: 2.1 mg/dL (ref 1.5–2.5)

## 2023-09-05 LAB — VITAMIN D 25 HYDROXY (VIT D DEFICIENCY, FRACTURES): VITD: 87.64 ng/mL (ref 30.00–100.00)

## 2023-09-05 LAB — PHOSPHORUS: Phosphorus: 3.3 mg/dL (ref 2.3–4.6)

## 2023-09-05 NOTE — Patient Instructions (Addendum)
Thank you for coming in today.   We will investigate prolia.   Fosamax is the backup.   I will get some labs to check calcium and other minerals and vit D.

## 2023-09-06 ENCOUNTER — Telehealth: Payer: Self-pay

## 2023-09-06 NOTE — Progress Notes (Signed)
Vitamin D level is probably higher than it should be at 87.  You could probably cut the amount of vitamin D you are taking in half. Calcium level looks just fine as does magnesium and phosphorus.  Okay to proceed to Prolia if you choose to.

## 2023-09-06 NOTE — Telephone Encounter (Signed)
Prolia VOB initiated via [email or web address]  Next Prolia inj DUE:

## 2023-09-06 NOTE — Telephone Encounter (Signed)
-----   Message from Adron Bene sent at 09/05/2023 11:23 AM EDT ----- Regarding: Prolia Please work to authorize Prolia for this pt

## 2023-09-06 NOTE — Telephone Encounter (Signed)
Rodolph Bong, MD 09/06/2023  6:29 AM EDT     Vitamin D level is probably higher than it should be at 87.  You could probably cut the amount of vitamin D you are taking in half. Calcium level looks just fine as does magnesium and phosphorus.  Okay to proceed to Prolia if you choose to.

## 2023-09-11 NOTE — Telephone Encounter (Signed)
NO Prior Auth required for [employer]

## 2023-09-11 NOTE — Telephone Encounter (Addendum)
Pt ready for scheduling on or after 09/11/23  Out-of-pocket cost due at time of visit: $320  Primary: [employer] Adv Plan I PPO Prolia co-insurance: 20% (approximately $320.99) Admin fee co-insurance: 0%   Deductible: does not apply  Prior Auth NOT required  Secondary: N/A Prolia co-insurance:  Admin fee co-insurance:  Deductible:  Prior Auth:  PA# Valid:   ** This summary of benefits is an estimation of the patient's out-of-pocket cost. Exact cost may vary based on individual plan coverage.

## 2023-09-13 NOTE — Telephone Encounter (Addendum)
Left message for patient to call back to schedule. Need to discuss cost as well.

## 2023-09-19 NOTE — Telephone Encounter (Signed)
Patient does not wish to proceed with Prolia due to cost.  She is currently on Alendronate 70mg  from her gynecologist.  Does she need to contact them for a refill or is this something Dr Denyse Amass will take over?

## 2023-09-25 MED ORDER — ALENDRONATE SODIUM 70 MG PO TABS: 70.0000 mg | ORAL_TABLET | ORAL | 3 refills | Status: DC

## 2023-09-25 NOTE — Addendum Note (Signed)
Addended by: Dierdre Searles on: 09/25/2023 01:13 PM   Modules accepted: Orders

## 2023-09-25 NOTE — Telephone Encounter (Signed)
Spoke with Dr. Denyse Amass, we will taking over management of Osteoporosis and future Fosamax prescriptions.   Rx sent to [facility].   Called pt and left VM for pt to call the office.

## 2023-09-26 NOTE — Telephone Encounter (Signed)
Called pt and left VM to call the office.  

## 2023-10-03 NOTE — Telephone Encounter (Signed)
Called pt and left VM to call the office.  

## 2023-10-04 MED ORDER — ALENDRONATE SODIUM 70 MG PO TABS: 70.0000 mg | ORAL_TABLET | ORAL | 3 refills | Status: DC

## 2023-10-04 NOTE — Telephone Encounter (Signed)
Pt returned call and was advised per Dr. Denyse Amass. Re-sent RX to [facility] per pt request.

## 2023-10-04 NOTE — Addendum Note (Signed)
Addended by: Dierdre Searles on: 10/04/2023 10:29 AM   Modules accepted: Orders

## 2024-01-21 ENCOUNTER — Ambulatory Visit (INDEPENDENT_AMBULATORY_CARE_PROVIDER_SITE_OTHER): Payer: PPO

## 2024-02-20 DIAGNOSIS — I10 Essential (primary) hypertension: Secondary | ICD-10-CM | POA: Diagnosis not present

## 2024-02-20 DIAGNOSIS — Z1211 Encounter for screening for malignant neoplasm of colon: Secondary | ICD-10-CM | POA: Diagnosis not present

## 2024-02-20 DIAGNOSIS — M810 Age-related osteoporosis without current pathological fracture: Secondary | ICD-10-CM | POA: Diagnosis not present

## 2024-02-20 DIAGNOSIS — Z1331 Encounter for screening for depression: Secondary | ICD-10-CM | POA: Diagnosis not present

## 2024-02-20 DIAGNOSIS — R0789 Other chest pain: Secondary | ICD-10-CM | POA: Diagnosis not present

## 2024-02-20 DIAGNOSIS — Z0000 Encounter for general adult medical examination without abnormal findings: Secondary | ICD-10-CM | POA: Diagnosis not present

## 2024-02-20 DIAGNOSIS — J309 Allergic rhinitis, unspecified: Secondary | ICD-10-CM | POA: Diagnosis not present

## 2024-02-20 DIAGNOSIS — E785 Hyperlipidemia, unspecified: Secondary | ICD-10-CM | POA: Diagnosis not present

## 2024-02-20 DIAGNOSIS — F320 Major depressive disorder, single episode, mild: Secondary | ICD-10-CM | POA: Diagnosis not present

## 2024-02-20 DIAGNOSIS — R430 Anosmia: Secondary | ICD-10-CM | POA: Diagnosis not present

## 2024-02-20 DIAGNOSIS — Z23 Encounter for immunization: Secondary | ICD-10-CM | POA: Diagnosis not present

## 2024-03-19 DIAGNOSIS — F320 Major depressive disorder, single episode, mild: Secondary | ICD-10-CM | POA: Diagnosis not present

## 2024-03-24 DIAGNOSIS — H43813 Vitreous degeneration, bilateral: Secondary | ICD-10-CM | POA: Diagnosis not present

## 2024-08-20 DIAGNOSIS — E785 Hyperlipidemia, unspecified: Secondary | ICD-10-CM | POA: Diagnosis not present

## 2024-08-20 DIAGNOSIS — Z23 Encounter for immunization: Secondary | ICD-10-CM | POA: Diagnosis not present

## 2024-08-20 DIAGNOSIS — F320 Major depressive disorder, single episode, mild: Secondary | ICD-10-CM | POA: Diagnosis not present

## 2024-08-20 DIAGNOSIS — R7303 Prediabetes: Secondary | ICD-10-CM | POA: Diagnosis not present

## 2024-08-20 DIAGNOSIS — Z1211 Encounter for screening for malignant neoplasm of colon: Secondary | ICD-10-CM | POA: Diagnosis not present

## 2024-08-20 DIAGNOSIS — I10 Essential (primary) hypertension: Secondary | ICD-10-CM | POA: Diagnosis not present

## 2024-09-03 DIAGNOSIS — Z1211 Encounter for screening for malignant neoplasm of colon: Secondary | ICD-10-CM | POA: Diagnosis not present

## 2024-09-03 DIAGNOSIS — Z800 Family history of malignant neoplasm of digestive organs: Secondary | ICD-10-CM | POA: Diagnosis not present

## 2024-09-03 DIAGNOSIS — R197 Diarrhea, unspecified: Secondary | ICD-10-CM | POA: Diagnosis not present

## 2024-09-09 DIAGNOSIS — Z1231 Encounter for screening mammogram for malignant neoplasm of breast: Secondary | ICD-10-CM | POA: Diagnosis not present

## 2024-09-09 DIAGNOSIS — N760 Acute vaginitis: Secondary | ICD-10-CM | POA: Diagnosis not present

## 2024-09-09 DIAGNOSIS — Z01419 Encounter for gynecological examination (general) (routine) without abnormal findings: Secondary | ICD-10-CM | POA: Diagnosis not present

## 2024-09-09 DIAGNOSIS — Z124 Encounter for screening for malignant neoplasm of cervix: Secondary | ICD-10-CM | POA: Diagnosis not present

## 2024-09-09 DIAGNOSIS — M8588 Other specified disorders of bone density and structure, other site: Secondary | ICD-10-CM | POA: Diagnosis not present

## 2024-09-09 DIAGNOSIS — Z6823 Body mass index (BMI) 23.0-23.9, adult: Secondary | ICD-10-CM | POA: Diagnosis not present

## 2024-09-09 LAB — HM DEXA SCAN

## 2024-09-30 DIAGNOSIS — K5730 Diverticulosis of large intestine without perforation or abscess without bleeding: Secondary | ICD-10-CM | POA: Diagnosis not present

## 2024-09-30 DIAGNOSIS — R194 Change in bowel habit: Secondary | ICD-10-CM | POA: Diagnosis not present

## 2024-09-30 DIAGNOSIS — R197 Diarrhea, unspecified: Secondary | ICD-10-CM | POA: Diagnosis not present

## 2024-09-30 DIAGNOSIS — D123 Benign neoplasm of transverse colon: Secondary | ICD-10-CM | POA: Diagnosis not present

## 2024-09-30 DIAGNOSIS — D120 Benign neoplasm of cecum: Secondary | ICD-10-CM | POA: Diagnosis not present

## 2024-10-02 DIAGNOSIS — D123 Benign neoplasm of transverse colon: Secondary | ICD-10-CM | POA: Diagnosis not present

## 2024-10-02 DIAGNOSIS — D120 Benign neoplasm of cecum: Secondary | ICD-10-CM | POA: Diagnosis not present

## 2024-10-07 ENCOUNTER — Other Ambulatory Visit: Payer: Self-pay

## 2024-10-07 ENCOUNTER — Telehealth: Payer: Self-pay | Admitting: Family Medicine

## 2024-10-07 DIAGNOSIS — M810 Age-related osteoporosis without current pathological fracture: Secondary | ICD-10-CM

## 2024-10-07 MED ORDER — ALENDRONATE SODIUM 70 MG PO TABS: 70.0000 mg | ORAL_TABLET | ORAL | 3 refills | Status: AC

## 2024-10-07 NOTE — Progress Notes (Signed)
 Refilled Fosamax  per Dr. Virgilio standing orders.

## 2024-10-07 NOTE — Telephone Encounter (Signed)
Called pt. No answer. Left VM requesting call back.

## 2024-10-07 NOTE — Telephone Encounter (Signed)
 Osteoporosis test came back from September 09, 2024.  Your osteoporosis numbers look better than they did 2 years ago.  It looks like you are currently using Fosamax .  Typical course of treatment for that is 5 years.  Recommend rechecking a bone density in 2 years.  If you would like to schedule a follow-up appointment with me to go over this in full detail that would be reasonable.  AP spine T-score -0.7 Left hip -2.1 Right hip -2.0

## 2024-10-07 NOTE — Telephone Encounter (Signed)
 Pt returned VM and was provided Dr. Virgilio result note. She didn't think there was a reason for f/u visit. She noted needing a refill of her Fosamax . Rx sent to pharmacy. Reminder set for 2 year to re-check bone density. Pt verbalized understanding.
